# Patient Record
Sex: Male | Born: 2000 | Race: White | Hispanic: Yes | Marital: Single | Smoking: Never smoker
Health system: Southern US, Community
[De-identification: ages and names within clinical notes are randomized; demographics above are authoritative.]

## PROBLEM LIST (undated history)

## (undated) DIAGNOSIS — J45909 Unspecified asthma, uncomplicated: Secondary | ICD-10-CM

---
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
## 2018-02-23 ENCOUNTER — Emergency Department (HOSPITAL_COMMUNITY)
Admission: EM | Admit: 2018-02-23 | Discharge: 2018-02-23 | Disposition: A | Discharge status: Home / Self Care | Payer: Medicaid Other | Attending: Emergency Medicine | Admitting: Emergency Medicine

## 2018-02-23 ENCOUNTER — Emergency Department (HOSPITAL_COMMUNITY): Payer: Medicaid Other

## 2018-02-23 ENCOUNTER — Encounter (HOSPITAL_COMMUNITY): Payer: Self-pay | Admitting: Emergency Medicine

## 2018-02-23 DIAGNOSIS — R1013 Epigastric pain: Secondary | ICD-10-CM | POA: Insufficient documentation

## 2018-02-23 DIAGNOSIS — R079 Chest pain, unspecified: Secondary | ICD-10-CM | POA: Diagnosis present

## 2018-02-23 LAB — CBC
HCT: 41.8 % (ref 39.0–52.0)
HEMOGLOBIN: 14.3 g/dL (ref 13.0–17.0)
MCH: 30.7 pg (ref 26.0–34.0)
MCHC: 34.2 g/dL (ref 30.0–36.0)
MCV: 89.7 fL (ref 78.0–100.0)
Platelets: 194 10*3/uL (ref 150–400)
RBC: 4.66 MIL/uL (ref 4.22–5.81)
RDW: 12.5 % (ref 11.5–15.5)
WBC: 7.5 10*3/uL (ref 4.0–10.5)

## 2018-02-23 LAB — BASIC METABOLIC PANEL
ANION GAP: 10 (ref 5–15)
BUN: 14 mg/dL (ref 6–20)
CALCIUM: 9.5 mg/dL (ref 8.9–10.3)
CO2: 27 mmol/L (ref 22–32)
Chloride: 101 mmol/L (ref 101–111)
Creatinine, Ser: 0.98 mg/dL (ref 0.61–1.24)
Glucose, Bld: 86 mg/dL (ref 65–99)
Potassium: 3.6 mmol/L (ref 3.5–5.1)
SODIUM: 138 mmol/L (ref 135–145)

## 2018-02-23 LAB — I-STAT TROPONIN, ED: TROPONIN I, POC: 0.01 ng/mL (ref 0.00–0.08)

## 2018-02-23 LAB — D-DIMER, QUANTITATIVE: D-Dimer, Quant: <0.27 ug/mL-FEU (ref 0.00–0.50)

## 2018-02-23 MED ORDER — GI COCKTAIL ~~LOC~~
30.0000 mL | Freq: Once | ORAL | Status: AC
  Administered 2018-02-23: 30 mL via ORAL
  Filled 2018-02-23: qty 30

## 2018-02-23 MED ORDER — OMEPRAZOLE 20 MG PO CPDR: 20.0000 mg | DELAYED_RELEASE_CAPSULE | Freq: Two times a day (BID) | ORAL | 0 refills | Status: DC

## 2018-02-23 MED ORDER — RANITIDINE HCL 150 MG PO CAPS: 150.0000 mg | ORAL_CAPSULE | Freq: Every day | ORAL | 0 refills | Status: DC

## 2018-02-23 NOTE — ED Triage Notes (Signed)
Pt to ER for intermittent central chest pain x2 weeks. In NAD.VSS.

## 2018-02-23 NOTE — ED Provider Notes (Signed)
[facility] EMERGENCY DEPARTMENT Provider Note   CSN: [id number] Arrival date & time: 02/23/18  1042     History   Chief Complaint Chief Complaint  Patient presents with  . Chest Pain    HPI Nathan Glover is a 17 y.o. male.  HPI  17 year old male presenting for evaluation of chest pain.  Patient report within the past 2 weeks he has had several separate episodes of midsternal chest pain.  He described pain as a sharp pressure sensation, with some lightheadedness and shortness of breath usually manifests in the center of his chest occasionally radiates to his left shoulder.  Pain usually lasting less than an hour without any provocation.  Most recent chest pain was this morning while he was in class.  It last for approximately 40 minutes and has since resolved.  No specific treatment tried at that time.  No associated nausea, vomiting, sweating, tingling sensation, productive cough, fever, chills, abdominal pain or back pain.  Pain is not brought on by exertion.  No history of exertional syncope.  Denies alcohol or tobacco abuse.  Denies any strong family history of cardiac disease.  No prior history of PE or DVT, no recent surgery, prolonged bed rest, leg swelling or calf pain.  He is currently pain-free.  Denies any postprandial pain.  He does have a PCP but did not follow-up for this complaint yet.    History reviewed. No pertinent past medical history.  There are no active problems to display for this patient.   History reviewed. No pertinent surgical history.      Home Medications    Prior to Admission medications   Medication Sig Start Date End Date Taking? Authorizing Provider  HYDROcodone-acetaminophen (NORCO/VICODIN) 5-325 MG per tablet Take 1-2 tablets by mouth every 4 (four) hours as needed for moderate pain or severe pain. 09/17/14   Mirian Mo, MD    Family History History reviewed. No pertinent family history.  Social  History Social History   Tobacco Use  . Smoking status: Never Smoker  . Smokeless tobacco: Never Used  Substance Use Topics  . Alcohol use: Not on file  . Drug use: Not on file     Allergies   Patient has no known allergies.   Review of Systems Review of Systems  All other systems reviewed and are negative.    Physical Exam Updated Vital Signs BP 115/67 (BP Location: Right Arm)   Pulse 76   Temp 98.6 F (37 C) (Oral)   Resp 18   SpO2 99%   Physical Exam  Constitutional: He is oriented to person, place, and time. He appears well-developed and well-nourished. No distress.  HENT:  Head: Atraumatic.  Eyes: Conjunctivae are normal.  Neck: Normal range of motion. Neck supple.  Cardiovascular: Normal rate, regular rhythm, intact distal pulses and normal pulses.  Pulmonary/Chest: Effort normal and breath sounds normal. He has no decreased breath sounds. He has no wheezes. He has no rhonchi. He has no rales.  Abdominal: Soft. There is no tenderness.  Musculoskeletal:       Right lower leg: He exhibits no edema.       Left lower leg: He exhibits no tenderness.  Neurological: He is alert and oriented to person, place, and time.  Skin: No rash noted.  Psychiatric: He has a normal mood and affect.  Nursing note and vitals reviewed.    ED Treatments / Results  Labs (all labs ordered are listed, but only  abnormal results are displayed) Labs Reviewed  BASIC METABOLIC PANEL  CBC  D-DIMER, QUANTITATIVE (NOT AT [facility])  I-STAT TROPONIN, ED    EKG None ED ECG REPORT   Date: 02/23/2018  Rate: 78  Rhythm: normal sinus rhythm  QRS Axis: right  Intervals: normal  ST/T Wave abnormalities: early repolarization  Conduction Disutrbances:incomplete RBBB  Narrative Interpretation:   Old EKG Reviewed: none available  I have personally reviewed the EKG tracing and agree with the computerized printout as noted.    Radiology Dg Chest 2 View  Result Date:  02/23/2018 CLINICAL DATA:  Mid sternal chest pain, shortness of Breath EXAM: CHEST - 2 VIEW COMPARISON:  None. FINDINGS: Heart and mediastinal contours are within normal limits. No focal opacities or effusions. No acute bony abnormality. IMPRESSION: No active cardiopulmonary disease. Electronically Signed   By: Charlett NoseKevin  Dover M.D.   On: 02/23/2018 11:17    Procedures Procedures (including critical care time)  Medications Ordered in ED Medications  gi cocktail (Maalox,Lidocaine,Donnatal) (30 mLs Oral Given 02/23/18 1249)     Initial Impression / Assessment and Plan / ED Course  I have reviewed the triage vital signs and the nursing notes.  Pertinent labs & imaging results that were available during my care of the patient were reviewed by me and considered in my medical decision making (see chart for details).     BP 115/67 (BP Location: Right Arm)   Pulse 76   Temp 98.6 F (37 C) (Oral)   Resp 18   SpO2 99%    Final Clinical Impressions(s) / ED Diagnoses   Final diagnoses:  Epigastric pain    ED Discharge Orders        Ordered    omeprazole (PRILOSEC) 20 MG capsule  2 times daily before meals     02/23/18 1425    ranitidine (ZANTAC) 150 MG capsule  Daily     02/23/18 1425     12:59 PM Patient developed intermittent chest pain shortness of breath. HEART score of 1, low risk of MACE.  D-dimer negative, doubt PE.  EKG showing incomplete RBBB, no prior for comparison. CXR unremarkable.  Pt is pain free. Care discussed with DR. Nanavati.  Pt d/c home with outpt f//u .  Return precaution given.   Suspect gastritis as pt report eating late at night usually at 12 am.  Counsel given.    Fayrene Helperran, Shyenne Maggard, PA-C 02/23/18 1426    Derwood KaplanNanavati, Ankit, MD 02/24/18 1529

## 2018-08-02 ENCOUNTER — Emergency Department (HOSPITAL_COMMUNITY)
Admission: EM | Admit: 2018-08-02 | Discharge: 2018-08-03 | Disposition: A | Discharge status: Home / Self Care | Payer: Medicaid Other | Attending: Emergency Medicine | Admitting: Emergency Medicine

## 2018-08-02 DIAGNOSIS — R0789 Other chest pain: Secondary | ICD-10-CM | POA: Insufficient documentation

## 2018-08-02 DIAGNOSIS — Z79899 Other long term (current) drug therapy: Secondary | ICD-10-CM | POA: Insufficient documentation

## 2018-08-02 NOTE — ED Triage Notes (Signed)
Pt states that for the past 3 days he has been having epigastric CP with nausea and some dizziness. Denies cough or heavy lifting

## 2018-08-03 ENCOUNTER — Emergency Department (HOSPITAL_COMMUNITY): Payer: Medicaid Other

## 2018-08-03 ENCOUNTER — Encounter (HOSPITAL_COMMUNITY): Payer: Self-pay

## 2018-08-03 LAB — CBC
HEMATOCRIT: 44.4 % (ref 39.0–52.0)
HEMOGLOBIN: 15 g/dL (ref 13.0–17.0)
MCH: 30.8 pg (ref 26.0–34.0)
MCHC: 33.8 g/dL (ref 30.0–36.0)
MCV: 91.2 fL (ref 78.0–100.0)
Platelets: 225 10*3/uL (ref 150–400)
RBC: 4.87 MIL/uL (ref 4.22–5.81)
RDW: 12.4 % (ref 11.5–15.5)
WBC: 8.6 10*3/uL (ref 4.0–10.5)

## 2018-08-03 LAB — BASIC METABOLIC PANEL
ANION GAP: 11 (ref 5–15)
BUN: 15 mg/dL (ref 6–20)
CHLORIDE: 102 mmol/L (ref 98–111)
CO2: 26 mmol/L (ref 22–32)
Calcium: 9.7 mg/dL (ref 8.9–10.3)
Creatinine, Ser: 1 mg/dL (ref 0.61–1.24)
GFR calc non Af Amer: >60 mL/min (ref >60)
Glucose, Bld: 100 mg/dL — ABNORMAL HIGH (ref 70–99)
POTASSIUM: 3.8 mmol/L (ref 3.5–5.1)
Sodium: 139 mmol/L (ref 135–145)

## 2018-08-03 LAB — I-STAT TROPONIN, ED: Troponin i, poc: 0 ng/mL (ref 0.00–0.08)

## 2018-08-03 NOTE — ED Notes (Signed)
Patient verbalizes understanding of discharge instructions. Opportunity for questioning and answers were provided. Armband removed by staff, pt discharged from ED ambulatory.   

## 2018-08-03 NOTE — ED Provider Notes (Signed)
[facility] EMERGENCY DEPARTMENT Provider Note  CSN: [id number] Arrival date & time: 08/02/18 2339  Chief Complaint(s) Chest Pain  HPI Nathan Glover is a 17 y.o. male   The history is provided by the patient.  Chest Pain   This is a recurrent problem. The current episode started 2 days ago. Episode frequency: intermittent. The problem has been resolved. Pain location: migrating across chest. The pain is moderate. The quality of the pain is described as stabbing. The pain does not radiate. The symptoms are aggravated by certain positions (lying down). Associated symptoms include nausea. Pertinent negatives include no back pain, no cough, no fever, no leg pain, no lower extremity edema, no shortness of breath and no vomiting.  Pertinent negatives for past medical history include no CAD, no CHF, no diabetes, no hyperlipidemia, no hypertension, no MI, no PE, no strokes and no TIA.   Similar from prior episode attributed to gastritis.  Past Medical History History reviewed. No pertinent past medical history. There are no active problems to display for this patient.  Home Medication(s) Prior to Admission medications   Medication Sig Start Date End Date Taking? Authorizing Provider  HYDROcodone-acetaminophen (NORCO/VICODIN) 5-325 MG per tablet Take 1-2 tablets by mouth every 4 (four) hours as needed for moderate pain or severe pain. 09/17/14   Mirian Mo, MD  omeprazole (PRILOSEC) 20 MG capsule Take 1 capsule (20 mg total) by mouth 2 (two) times daily before a meal. 02/23/18   Fayrene Helper, PA-C  ranitidine (ZANTAC) 150 MG capsule Take 1 capsule (150 mg total) by mouth daily. 02/23/18   Fayrene Helper, PA-C                                                                                                                                    Past Surgical History History reviewed. No pertinent surgical history. Family History No family history on file.  Social  History Social History   Tobacco Use  . Smoking status: Never Smoker  . Smokeless tobacco: Never Used  Substance Use Topics  . Alcohol use: Not on file  . Drug use: Not on file   Allergies Patient has no known allergies.  Review of Systems Review of Systems  Constitutional: Negative for fever.  Respiratory: Negative for cough and shortness of breath.   Cardiovascular: Positive for chest pain.  Gastrointestinal: Positive for nausea. Negative for vomiting.  Musculoskeletal: Negative for back pain.   All other systems are reviewed and are negative for acute change except as noted in the HPI  Physical Exam Vital Signs  I have reviewed the triage vital signs BP 117/75   Pulse 62   Temp 98.4 F (36.9 C) (Oral)   Resp 14   SpO2 99%   Physical Exam  Constitutional: He is oriented to person, place, and time. He appears well-developed and well-nourished. No distress.  HENT:  Head: Normocephalic and atraumatic.  Nose:  Nose normal.  Eyes: Pupils are equal, round, and reactive to light. Conjunctivae and EOM are normal. Right eye exhibits no discharge. Left eye exhibits no discharge. No scleral icterus.  Neck: Normal range of motion. Neck supple.  Cardiovascular: Normal rate and regular rhythm. Exam reveals no gallop and no friction rub.  No murmur heard. Pulmonary/Chest: Effort normal and breath sounds normal. No stridor. No respiratory distress. He has no rales.  Abdominal: Soft. He exhibits no distension. There is no tenderness.  Musculoskeletal: He exhibits no edema or tenderness.  Neurological: He is alert and oriented to person, place, and time.  Skin: Skin is warm and dry. No rash noted. He is not diaphoretic. No erythema.  Psychiatric: He has a normal mood and affect.  Vitals reviewed.   ED Results and Treatments Labs (all labs ordered are listed, but only abnormal results are displayed) Labs Reviewed  BASIC METABOLIC PANEL - Abnormal; Notable for the following  components:      Result Value   Glucose, Bld 100 (*)    All other components within normal limits  CBC  I-STAT TROPONIN, ED                                                                                                                         EKG  EKG Interpretation  Date/Time:  Tuesday August 02 2018 23:47:47 EDT Ventricular Rate:  73 PR Interval:  142 QRS Duration: 100 QT Interval:  354 QTC Calculation: 389 R Axis:   87 Text Interpretation:  Normal sinus rhythm with sinus arrhythmia Incomplete right bundle branch block Borderline ECG No significant change since last tracing Confirmed by Drema Pryardama, Andrika Peraza [phone]([id number]) on 08/03/2018 3:34:16 AM      Radiology Dg Chest 2 View  Result Date: 08/03/2018 CLINICAL DATA:  Epigastric chest pain, nausea, and dizziness for 3 days. EXAM: CHEST - 2 VIEW COMPARISON:  02/23/2018 FINDINGS: The heart size and mediastinal contours are within normal limits. Both lungs are clear. The visualized skeletal structures are unremarkable. IMPRESSION: No active cardiopulmonary disease. Electronically Signed   By: Burman NievesWilliam  Stevens M.D.   On: 08/03/2018 00:14   Pertinent labs & imaging results that were available during my care of the patient were reviewed by me and considered in my medical decision making (see chart for details).  Medications Ordered in ED Medications - No data to display  Procedures Procedures  (including critical care time)  Medical Decision Making / ED Course I have reviewed the nursing notes for this encounter and the patient's prior records (if available in EHR or on provided paperwork).    Highly atypical.  Inconsistent with ACS.  EKG without acute ischemic changes or evidence of pericarditis.  Troponin negative.  Do not feel that additional cardiac markers are necessary at this time.  Presentation likely GI  related.  Doubt pulmonary embolism and PERC negative.  Low suspicion for serious intra-abdominal inflammatory/infectious process.  Chest x-ray without evidence suggestive of pneumonia, pneumothorax, pneumomediastinum.  No abnormal contour of the mediastinum to suggest dissection. No evidence of acute injuries.  The patient appears reasonably screened and/or stabilized for discharge and I doubt any other medical condition or other [facility] requiring further screening, evaluation, or treatment in the ED at this time prior to discharge.  The patient is safe for discharge with strict return precautions.   Final Clinical Impression(s) / ED Diagnoses Final diagnoses:  Atypical chest pain    Disposition: Discharge  Condition: Good  I have discussed the results, Dx and Tx plan with the patient who expressed understanding and agree(s) with the plan. Discharge instructions discussed at great length. The patient was given strict return precautions who verbalized understanding of the instructions. No further questions at time of discharge.    ED Discharge Orders    None       Follow Up: Primary care provider   in 5-7 days, If symptoms do not improve or  worsen     This chart was dictated using voice recognition software.  Despite best efforts to proofread,  errors can occur which can change the documentation meaning.   Nira Conn, MD 08/03/18 [phone]

## 2019-02-11 ENCOUNTER — Encounter (HOSPITAL_COMMUNITY): Payer: Self-pay

## 2019-02-11 ENCOUNTER — Other Ambulatory Visit: Payer: Self-pay

## 2019-02-11 ENCOUNTER — Emergency Department (HOSPITAL_COMMUNITY)
Admission: EM | Admit: 2019-02-11 | Discharge: 2019-02-12 | Disposition: A | Discharge status: Home / Self Care | Payer: Medicaid Other | Attending: Emergency Medicine | Admitting: Emergency Medicine

## 2019-02-11 DIAGNOSIS — Y9389 Activity, other specified: Secondary | ICD-10-CM | POA: Insufficient documentation

## 2019-02-11 DIAGNOSIS — W19XXXA Unspecified fall, initial encounter: Secondary | ICD-10-CM

## 2019-02-11 DIAGNOSIS — Y999 Unspecified external cause status: Secondary | ICD-10-CM | POA: Insufficient documentation

## 2019-02-11 DIAGNOSIS — Z79899 Other long term (current) drug therapy: Secondary | ICD-10-CM | POA: Diagnosis not present

## 2019-02-11 DIAGNOSIS — S0101XA Laceration without foreign body of scalp, initial encounter: Secondary | ICD-10-CM | POA: Insufficient documentation

## 2019-02-11 DIAGNOSIS — Y9289 Other specified places as the place of occurrence of the external cause: Secondary | ICD-10-CM | POA: Diagnosis not present

## 2019-02-11 DIAGNOSIS — S0990XA Unspecified injury of head, initial encounter: Secondary | ICD-10-CM | POA: Diagnosis present

## 2019-02-11 DIAGNOSIS — W108XXA Fall (on) (from) other stairs and steps, initial encounter: Secondary | ICD-10-CM | POA: Diagnosis not present

## 2019-02-11 MED ORDER — TETANUS-DIPHTH-ACELL PERTUSSIS 5-2.5-18.5 LF-MCG/0.5 IM SUSP
0.5000 mL | Freq: Once | INTRAMUSCULAR | Status: AC
  Administered 2019-02-11: 0.5 mL via INTRAMUSCULAR
  Filled 2019-02-11: qty 0.5

## 2019-02-11 MED ORDER — LIDOCAINE-EPINEPHRINE-TETRACAINE (LET) SOLUTION
3.0000 mL | Freq: Once | NASAL | Status: AC
  Administered 2019-02-11: 3 mL via TOPICAL
  Filled 2019-02-11: qty 3

## 2019-02-11 NOTE — ED Triage Notes (Signed)
Pt fell down from steps at home. Hit the back of his head. 1 inch laceration noted. Bleed controlled. Denies loss of consciousness. States "he was shaking when he hit the ground". Unaware if up to date with tetanus shot.  Pupils equal and reactive to light.

## 2019-02-11 NOTE — ED Notes (Signed)
Patient transported to CT 

## 2019-02-12 ENCOUNTER — Emergency Department (HOSPITAL_COMMUNITY): Payer: Medicaid Other

## 2019-02-12 DIAGNOSIS — S0101XA Laceration without foreign body of scalp, initial encounter: Secondary | ICD-10-CM | POA: Diagnosis not present

## 2019-02-12 DIAGNOSIS — Z79899 Other long term (current) drug therapy: Secondary | ICD-10-CM | POA: Diagnosis not present

## 2019-02-12 MED ORDER — IBUPROFEN 400 MG PO TABS
600.0000 mg | ORAL_TABLET | Freq: Once | ORAL | Status: AC
  Administered 2019-02-12: 600 mg via ORAL
  Filled 2019-02-12: qty 1

## 2019-02-12 NOTE — ED Provider Notes (Signed)
[facility] EMERGENCY DEPARTMENT Provider Note   CSN: [id number] Arrival date & time: 02/11/19  2309    History   Chief Complaint Chief Complaint  Patient presents with   Laceration    HPI Nathan Glover is a 18 y.o. male with no pertinent past medical history who presents to the emergency department with a chief complaint of fall.  The patient reports that he tripped over his flip-flop and fell backwards down approximately 5 wooden steps.  He reports that the injury occurred just prior to arrival.  His significant other reports that his arms and legs were shaking for approximately 5-10 seconds after the fall.  The patient states that he does not remember this, but was aware and answering questions within 10 seconds after shaking of his arms and legs ceased.  He is unsure if he blacked out.  He reports that he was able to stand and walk after the fall.   He has a laceration to the right posterior scalp.  He is complaining of a generalized headache. He denies any fever, chills, cough, dizziness, visual changes, neck pain, nausea, vomiting, abdominal pain, chest pain.  No history of seizures or other pertinent past medical history.  He denies alcohol use or other IV or recreational drug use.  No treatment prior to arrival.  He does not take any blood thinners.  He is unsure when his Tdap was updated.     The history is provided by the patient. No language interpreter was used.    History reviewed. No pertinent past medical history.  There are no active problems to display for this patient.   History reviewed. No pertinent surgical history.      Home Medications    Prior to Admission medications   Medication Sig Start Date End Date Taking? Authorizing Provider  HYDROcodone-acetaminophen (NORCO/VICODIN) 5-325 MG per tablet Take 1-2 tablets by mouth every 4 (four) hours as needed for moderate pain or severe pain. 09/17/14   Mirian Mo, MD    omeprazole (PRILOSEC) 20 MG capsule Take 1 capsule (20 mg total) by mouth 2 (two) times daily before a meal. 02/23/18   Fayrene Helper, PA-C  ranitidine (ZANTAC) 150 MG capsule Take 1 capsule (150 mg total) by mouth daily. 02/23/18   Fayrene Helper, PA-C    Family History History reviewed. No pertinent family history.  Social History Social History   Tobacco Use   Smoking status: Never Smoker   Smokeless tobacco: Never Used  Substance Use Topics   Alcohol use: Not on file   Drug use: Not on file     Allergies   Patient has no known allergies.   Review of Systems Review of Systems  Constitutional: Negative for appetite change and fever.  HENT: Negative for congestion, sinus pressure, sinus pain and sore throat.   Eyes: Negative for visual disturbance.  Respiratory: Negative for shortness of breath.   Cardiovascular: Negative for chest pain.  Gastrointestinal: Negative for abdominal pain, diarrhea, nausea and vomiting.  Genitourinary: Negative for dysuria, hematuria and urgency.  Musculoskeletal: Negative for back pain.  Skin: Positive for wound. Negative for rash.  Allergic/Immunologic: Negative for immunocompromised state.  Neurological: Positive for headaches. Negative for dizziness, speech difficulty and weakness.  Psychiatric/Behavioral: Negative for confusion.     Physical Exam Updated Vital Signs BP 121/84 (BP Location: Right Arm)    Pulse (!) 110    Temp 99 F (37.2 C) (Oral)    Resp 16    Ht   (1.727 m)    Wt 70.3 kg    SpO2 97%    BMI 23.57 kg/m   Physical Exam Vitals signs and nursing note reviewed.  Constitutional:      Appearance: He is well-developed.  HENT:     Head: Normocephalic.  Eyes:     Extraocular Movements: Extraocular movements intact.     Conjunctiva/sclera: Conjunctivae normal.     Pupils: Pupils are equal, round, and reactive to light.  Neck:     Musculoskeletal: Neck supple.  Cardiovascular:     Rate and Rhythm: Normal rate and  regular rhythm.     Heart sounds: No murmur.  Pulmonary:     Effort: Pulmonary effort is normal. No respiratory distress.     Breath sounds: No stridor. No wheezing, rhonchi or rales.  Chest:     Chest wall: No tenderness.  Abdominal:     General: There is no distension.     Palpations: Abdomen is soft.  Skin:    General: Skin is warm and dry.     Comments: There is a 2 cm laceration that is straight and is superficial noted to the right occipital scalp.  Minimal bleeding is noted from the wound.  No obvious foreign bodies in the wound appears clean.  Neurological:     Mental Status: He is alert.     Comments: Alert and oriented x3.  GCS 15.  Moves all 4 extremities.  Ambulatory without difficulty.  Finger-to-nose is intact bilaterally.  Cranial nerves II through XII are grossly intact.  Sensation is intact and equal throughout.  5-5 strength against resistance of the bilateral upper and lower extremities.  Psychiatric:        Behavior: Behavior normal.      ED Treatments / Results  Labs (all labs ordered are listed, but only abnormal results are displayed) Labs Reviewed - No data to display  EKG None  Radiology Ct Head Wo Contrast  Result Date: 02/12/2019 CLINICAL DATA:  Initial evaluation for acute headache status post trauma. EXAM: CT HEAD WITHOUT CONTRAST CT CERVICAL SPINE WITHOUT CONTRAST TECHNIQUE: Multidetector CT imaging of the head and cervical spine was performed following the standard protocol without intravenous contrast. Multiplanar CT image reconstructions of the cervical spine were also generated. COMPARISON:  None. FINDINGS: CT HEAD FINDINGS Brain: Cerebral volume within normal limits for patient age. No evidence for acute intracranial hemorrhage. No findings to suggest acute large vessel territory infarct. No mass lesion, midline shift, or mass effect. Ventricles are normal in size without evidence for hydrocephalus. No extra-axial fluid collection identified.  Vascular: No hyperdense vessel identified. Skull: Small soft tissue contusion with laceration at the right occipital scalp. No associated calvarial fracture. Sinuses/Orbits: Globes and orbital soft tissues within normal limits. Mild mucosal thickening within the right maxillary sinus. Paranasal sinuses are otherwise clear. No mastoid effusion. CT CERVICAL SPINE FINDINGS Alignment: Straightening with slight reversal of the normal cervical lordosis. No listhesis or malalignment. Skull base and vertebrae: Skull base intact. Normal C1-2 articulations are preserved in the dens is intact. Vertebral body heights maintained. No acute fracture. Soft tissues and spinal canal: Soft tissues of the neck demonstrate no acute finding. No abnormal prevertebral edema. Spinal canal within normal limits. Disc levels: No significant degenerative disc disease within the cervical spine. Upper chest: Visualized upper chest demonstrates no acute finding. Visualized lung apices are clear. Other: None. IMPRESSION: CT BRAIN: 1. No acute intracranial abnormality. 2. Small right occipital scalp contusion with laceration. No  associated calvarial fracture. CT CERVICAL SPINE: No acute traumatic injury within the cervical spine. Electronically Signed   By: Rise Mu M.D.   On: 02/12/2019 00:45   Ct Cervical Spine Wo Contrast  Result Date: 02/12/2019 CLINICAL DATA:  Initial evaluation for acute headache status post trauma. EXAM: CT HEAD WITHOUT CONTRAST CT CERVICAL SPINE WITHOUT CONTRAST TECHNIQUE: Multidetector CT imaging of the head and cervical spine was performed following the standard protocol without intravenous contrast. Multiplanar CT image reconstructions of the cervical spine were also generated. COMPARISON:  None. FINDINGS: CT HEAD FINDINGS Brain: Cerebral volume within normal limits for patient age. No evidence for acute intracranial hemorrhage. No findings to suggest acute large vessel territory infarct. No mass lesion,  midline shift, or mass effect. Ventricles are normal in size without evidence for hydrocephalus. No extra-axial fluid collection identified. Vascular: No hyperdense vessel identified. Skull: Small soft tissue contusion with laceration at the right occipital scalp. No associated calvarial fracture. Sinuses/Orbits: Globes and orbital soft tissues within normal limits. Mild mucosal thickening within the right maxillary sinus. Paranasal sinuses are otherwise clear. No mastoid effusion. CT CERVICAL SPINE FINDINGS Alignment: Straightening with slight reversal of the normal cervical lordosis. No listhesis or malalignment. Skull base and vertebrae: Skull base intact. Normal C1-2 articulations are preserved in the dens is intact. Vertebral body heights maintained. No acute fracture. Soft tissues and spinal canal: Soft tissues of the neck demonstrate no acute finding. No abnormal prevertebral edema. Spinal canal within normal limits. Disc levels: No significant degenerative disc disease within the cervical spine. Upper chest: Visualized upper chest demonstrates no acute finding. Visualized lung apices are clear. Other: None. IMPRESSION: CT BRAIN: 1. No acute intracranial abnormality. 2. Small right occipital scalp contusion with laceration. No associated calvarial fracture. CT CERVICAL SPINE: No acute traumatic injury within the cervical spine. Electronically Signed   By: Rise Mu M.D.   On: 02/12/2019 00:45    Procedures .Marland KitchenLaceration Repair Date/Time: 02/12/2019 1:22 AM Performed by: Barkley Boards, PA-C Authorized by: Barkley Boards, PA-C   Consent:    Consent obtained:  Verbal   Consent given by:  Patient   Risks discussed:  Infection, poor cosmetic result, poor wound healing, retained foreign body and nerve damage   Alternatives discussed:  No treatment Anesthesia (see MAR for exact dosages):    Anesthesia method:  Topical application   Topical anesthetic:  LET Laceration details:     Location:  Scalp   Scalp location:  Occipital   Length (cm):  2 Repair type:    Repair type:  Simple Pre-procedure details:    Preparation:  Patient was prepped and draped in usual sterile fashion and imaging obtained to evaluate for foreign bodies Exploration:    Hemostasis achieved with:  Direct pressure   Wound exploration: wound explored through full range of motion and entire depth of wound probed and visualized   Treatment:    Area cleansed with:  Saline   Amount of cleaning:  Standard   Visualized foreign bodies/material removed: no   Skin repair:    Repair method:  Staples   Number of staples:  4 Approximation:    Approximation:  Close Post-procedure details:    Dressing:  Open (no dressing)   Patient tolerance of procedure:  Tolerated well, no immediate complications   (including critical care time)  Medications Ordered in ED Medications  ibuprofen (ADVIL,MOTRIN) tablet 600 mg (has no administration in time range)  Tdap (BOOSTRIX) injection 0.5 mL (0.5 mLs Intramuscular Given  02/11/19 2352)  lidocaine-EPINEPHrine-tetracaine (LET) solution (3 mLs Topical Given 02/11/19 2353)     Initial Impression / Assessment and Plan / ED Course  I have reviewed the triage vital signs and the nursing notes.  Pertinent labs & imaging results that were available during my care of the patient were reviewed by me and considered in my medical decision making (see chart for details).        18 year old male who presents to the emergency department with a chief complaint of fall.  He had a mechanical fall down approximately 5 wooden steps after he tripped over his flip-flops.  He has a 2 cm laceration to the right occipital scalp.  He is unsure if he syncopized.  His significant other, who is not present in the ER, reports that he had shaking of his arms and legs for less than 10 seconds and within 10 seconds after this shaking subsided but he was awake, oriented, and answering questions.   He was able to ambulate independently after the fall.  Neurologic exam in the ER is intact.  Tdap updated in the ED.  Let was applied to the wound.  CT head and cervical spine are unremarkable.  On his exam, he had no other focal tenderness and was endorsing no other areas of pain.  Prior to wound repair, the base of the wound was visualized and there were no obvious foreign bodies.  The wound was repaired with 4 staples and was hemostatic following laceration repair.  Ibuprofen given for pain control.  He was given precautions on postconcussive syndrome.  Based on the history of present illness, I have a low suspicion that the patient had a seizure.  He was given return precautions to the ER for new or worsening symptoms and advised to have the staples removed in 10 days.  He is hemodynamically stable and in no acute distress.  Safe for discharge home with outpatient follow-up at this time.    Final Clinical Impressions(s) / ED Diagnoses   Final diagnoses:  Fall, initial encounter  Laceration of scalp, initial encounter    ED Discharge Orders    None       Petra Sargeant A, PA-C 02/12/19 0124    Ward, Layla Maw, DO 02/12/19 [phone]

## 2019-02-12 NOTE — Discharge Instructions (Addendum)
Thank you for allowing me to care for you today in the Emergency Department.   Your staples need to be removed in 10 days.  You can have them removed by coming back to the ER, going to urgent care, or following up with primary practice.  You can take 600 mg of ibuprofen with food every 6 hours for pain control or take 650 mg of Tylenol once every 6 hours.  Alternatively, you can alternate between these 2 medications every 3 hours.  For example, you can take a dose of ibuprofen at noon then take a dose of Tylenol at 3 PM then a second dose of ibuprofen at 6 PM.  Keep the area clean and dry with warm soap and water.  Additional wound care instructions are attached along with your discharge instructions.  As we discussed, sometimes after a head injury you can develop concussion-like symptoms.  This can include difficulty with memory or thinking of words.  Typically these symptoms will resolve on their own within a few weeks.  Return to the emergency department if you develop fever, chills, new numbness or weakness, changes in your vision, persistent vomiting, if you have any fall or injury, or persistent shaking or jerking.

## 2020-05-16 DIAGNOSIS — Z419 Encounter for procedure for purposes other than remedying health state, unspecified: Secondary | ICD-10-CM | POA: Diagnosis not present

## 2020-06-16 DIAGNOSIS — Z419 Encounter for procedure for purposes other than remedying health state, unspecified: Secondary | ICD-10-CM | POA: Diagnosis not present

## 2020-07-17 DIAGNOSIS — Z419 Encounter for procedure for purposes other than remedying health state, unspecified: Secondary | ICD-10-CM | POA: Diagnosis not present

## 2020-08-06 IMAGING — CT CT CERVICAL SPINE WITHOUT CONTRAST
4 of 7 series · 12 of 33 positions shown, 13 images · non-contrast
Comparison: None.

CLINICAL DATA: Initial evaluation for acute headache status post
trauma.

EXAM:
CT HEAD WITHOUT CONTRAST
CT CERVICAL SPINE WITHOUT CONTRAST
TECHNIQUE: Multidetector CT imaging of the head and cervical spine was
performed following the standard protocol without intravenous
contrast. Multiplanar CT image reconstructions of the cervical spine
were also generated.

[Series 9: c_spine 2.0 st · axial · 0.27mm/px · z∈[-297,-183]mm · 4 of 95 slices shown, 5 images]
[im 19/95  soft-tissue]
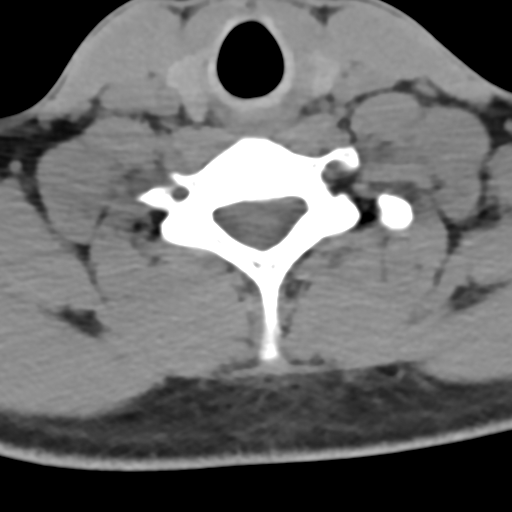
[im 19/95  bone]
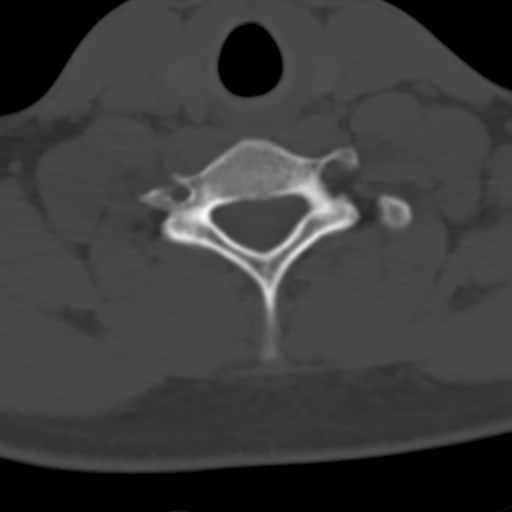
[im 38/95  bone]
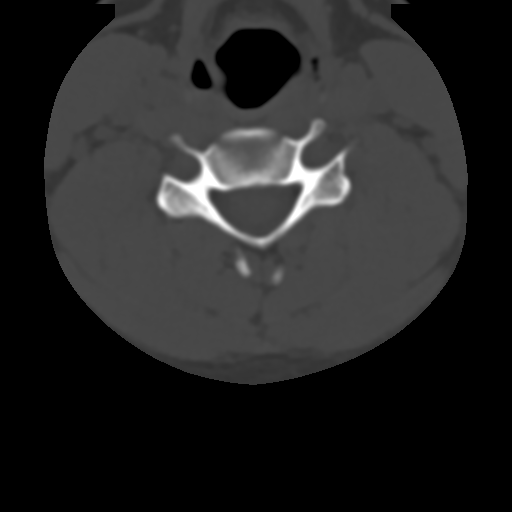
[im 57/95  bone]
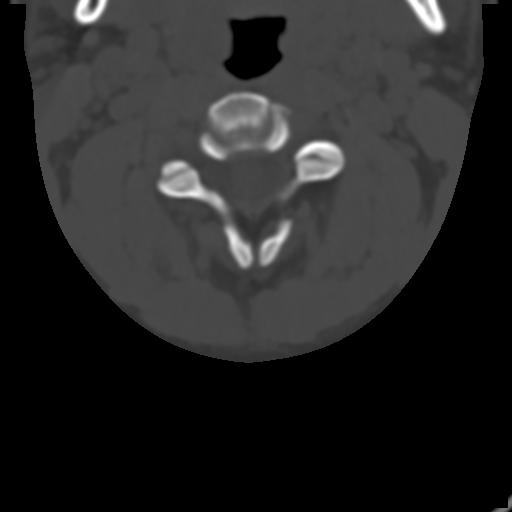
[im 76/95  bone]
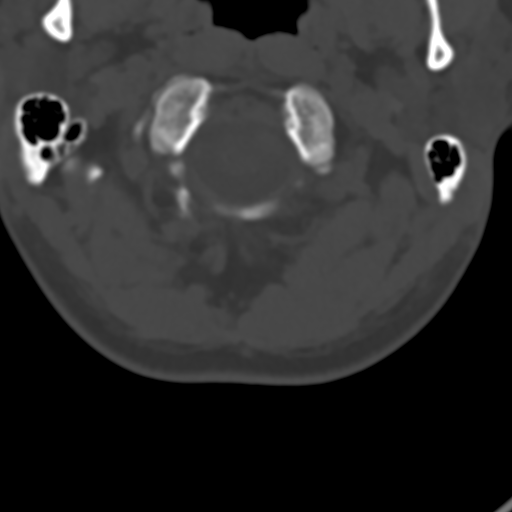

[Series 10: coronal bone · coronal · 0.24mm/px · 1 of 61 slices shown]
[im 31/61  bone]
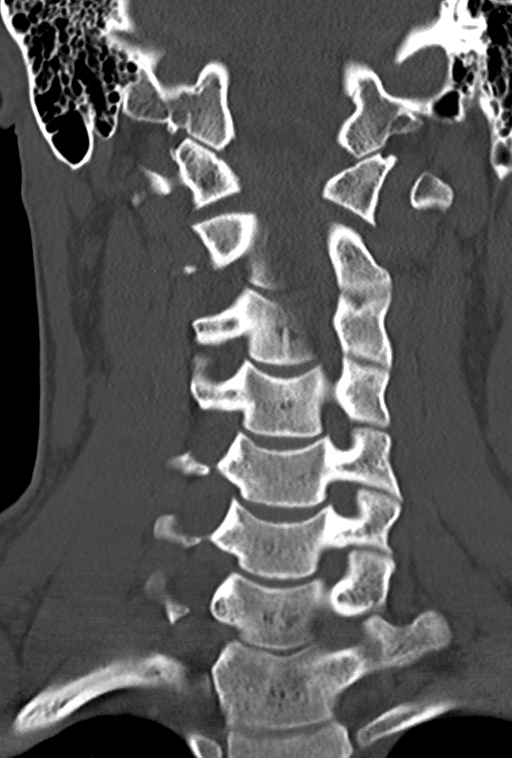

[Series 11: sagittal bone · sagittal · 0.24mm/px · 4 of 61 slices shown]
[im 13/61  bone]
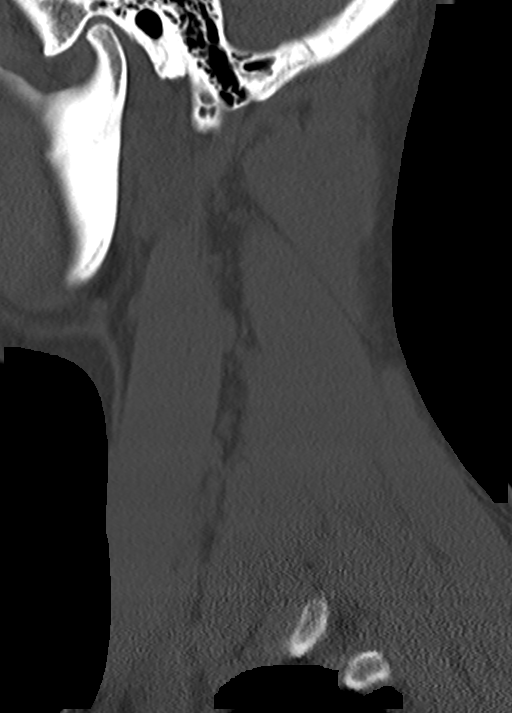
[im 25/61  bone]
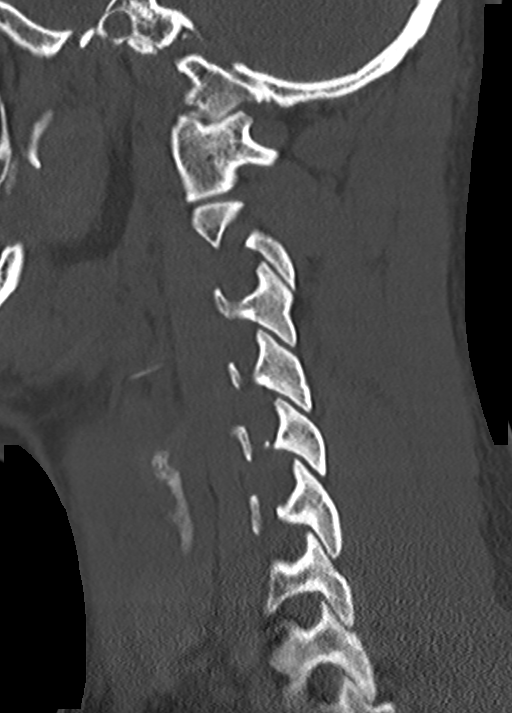
[im 37/61  bone]
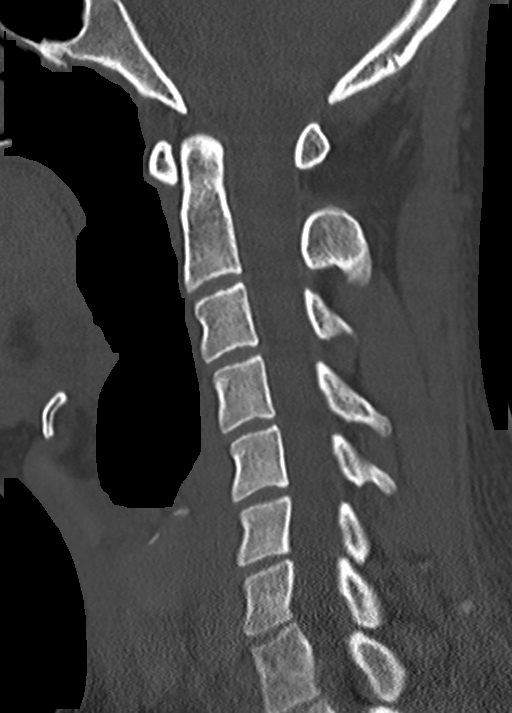
[im 49/61  bone]
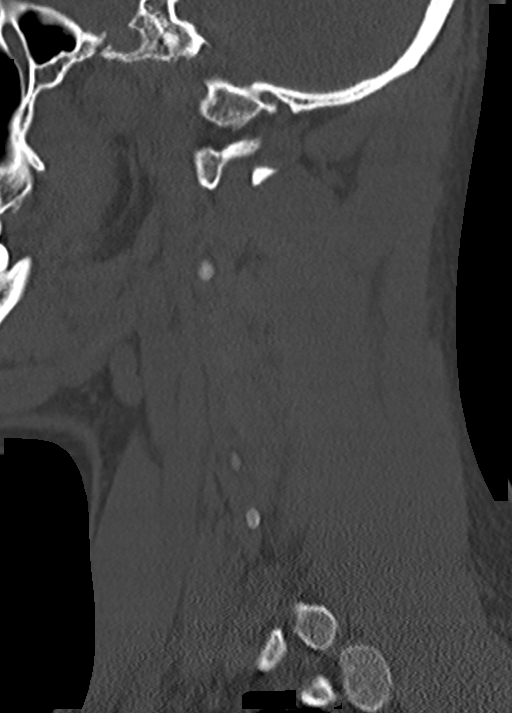

[Series 13: orthogonal axial st · axial · 0.21mm/px · z∈[-297,-228]mm · 3 of 81 slices shown]
[im 21/81  bone]
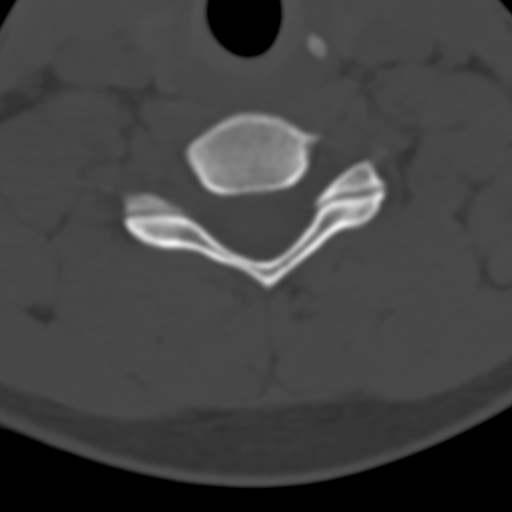
[im 41/81  bone]
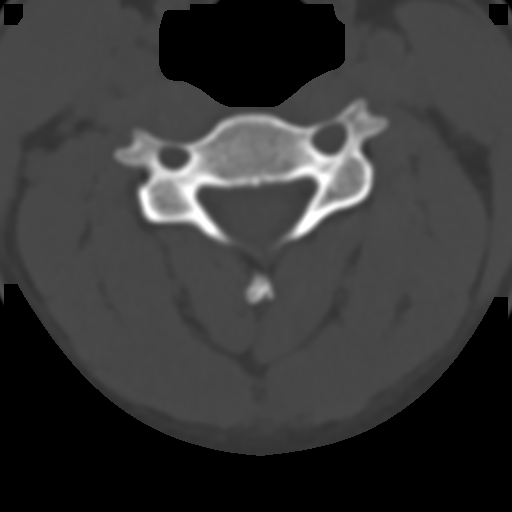
[im 61/81  bone]
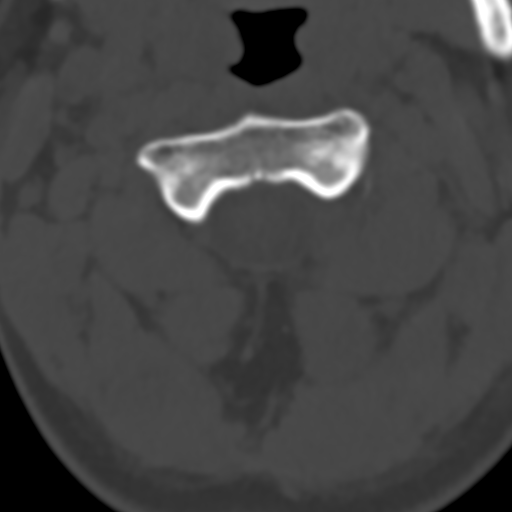

[12 of 33 positions shown; findings below may reference images not displayed]

FINDINGS: CT HEAD FINDINGS

Brain: Cerebral volume within normal limits for patient age.

No evidence for acute intracranial hemorrhage. No findings to
suggest acute large vessel territory infarct. No mass lesion,
midline shift, or mass effect. Ventricles are normal in size without
evidence for hydrocephalus. No extra-axial fluid collection
identified.

Vascular: No hyperdense vessel identified.

Skull: Small soft tissue contusion with laceration at the right
occipital scalp. No associated calvarial fracture.

Sinuses/Orbits: Globes and orbital soft tissues within normal
limits.

Mild mucosal thickening within the right maxillary sinus. Paranasal
sinuses are otherwise clear. No mastoid effusion.

CT CERVICAL SPINE FINDINGS

Alignment: Straightening with slight reversal of the normal cervical
lordosis. No listhesis or malalignment.

Skull base and vertebrae: Skull base intact. Normal C1-2
articulations are preserved in the dens is intact. Vertebral body
heights maintained. No acute fracture.

Soft tissues and spinal canal: Soft tissues of the neck demonstrate
no acute finding. No abnormal prevertebral edema. Spinal canal
within normal limits.

Disc levels: No significant degenerative disc disease within the
cervical spine.

Upper chest: Visualized upper chest demonstrates no acute finding.
Visualized lung apices are clear.

Other: None.
IMPRESSION: CT BRAIN:

1. No acute intracranial abnormality.
2. Small right occipital scalp contusion with laceration. No
associated calvarial fracture.

CT CERVICAL SPINE:

No acute traumatic injury within the cervical spine.

## 2020-08-16 DIAGNOSIS — Z419 Encounter for procedure for purposes other than remedying health state, unspecified: Secondary | ICD-10-CM | POA: Diagnosis not present

## 2020-09-16 DIAGNOSIS — Z419 Encounter for procedure for purposes other than remedying health state, unspecified: Secondary | ICD-10-CM | POA: Diagnosis not present

## 2020-10-16 DIAGNOSIS — Z419 Encounter for procedure for purposes other than remedying health state, unspecified: Secondary | ICD-10-CM | POA: Diagnosis not present

## 2020-11-16 DIAGNOSIS — Z419 Encounter for procedure for purposes other than remedying health state, unspecified: Secondary | ICD-10-CM | POA: Diagnosis not present

## 2020-12-17 DIAGNOSIS — Z419 Encounter for procedure for purposes other than remedying health state, unspecified: Secondary | ICD-10-CM | POA: Diagnosis not present

## 2021-01-14 DIAGNOSIS — Z419 Encounter for procedure for purposes other than remedying health state, unspecified: Secondary | ICD-10-CM | POA: Diagnosis not present

## 2021-02-14 DIAGNOSIS — Z419 Encounter for procedure for purposes other than remedying health state, unspecified: Secondary | ICD-10-CM | POA: Diagnosis not present

## 2021-03-16 DIAGNOSIS — Z419 Encounter for procedure for purposes other than remedying health state, unspecified: Secondary | ICD-10-CM | POA: Diagnosis not present

## 2021-04-16 DIAGNOSIS — Z419 Encounter for procedure for purposes other than remedying health state, unspecified: Secondary | ICD-10-CM | POA: Diagnosis not present

## 2021-05-07 ENCOUNTER — Emergency Department (HOSPITAL_COMMUNITY): Payer: Medicaid Other

## 2021-05-07 ENCOUNTER — Emergency Department (HOSPITAL_COMMUNITY)
Admission: EM | Admit: 2021-05-07 | Discharge: 2021-05-07 | Disposition: A | Discharge status: Home / Self Care | Payer: Medicaid Other | Attending: Emergency Medicine | Admitting: Emergency Medicine

## 2021-05-07 ENCOUNTER — Encounter (HOSPITAL_COMMUNITY): Payer: Self-pay

## 2021-05-07 ENCOUNTER — Other Ambulatory Visit: Payer: Self-pay

## 2021-05-07 DIAGNOSIS — R001 Bradycardia, unspecified: Secondary | ICD-10-CM | POA: Diagnosis not present

## 2021-05-07 DIAGNOSIS — R1010 Upper abdominal pain, unspecified: Secondary | ICD-10-CM | POA: Diagnosis not present

## 2021-05-07 DIAGNOSIS — J45909 Unspecified asthma, uncomplicated: Secondary | ICD-10-CM | POA: Insufficient documentation

## 2021-05-07 DIAGNOSIS — R112 Nausea with vomiting, unspecified: Secondary | ICD-10-CM | POA: Diagnosis not present

## 2021-05-07 DIAGNOSIS — R630 Anorexia: Secondary | ICD-10-CM | POA: Diagnosis not present

## 2021-05-07 DIAGNOSIS — R1011 Right upper quadrant pain: Secondary | ICD-10-CM | POA: Diagnosis not present

## 2021-05-07 HISTORY — DX: Unspecified asthma, uncomplicated: J45.909

## 2021-05-07 LAB — URINALYSIS, ROUTINE W REFLEX MICROSCOPIC
Bilirubin Urine: NEGATIVE
Glucose, UA: NEGATIVE mg/dL
Hgb urine dipstick: NEGATIVE
Ketones, ur: 5 mg/dL — AB
Leukocytes,Ua: NEGATIVE
Nitrite: NEGATIVE
Protein, ur: NEGATIVE mg/dL
Specific Gravity, Urine: 1.036 — ABNORMAL HIGH (ref 1.005–1.030)
pH: 5 (ref 5.0–8.0)

## 2021-05-07 LAB — COMPREHENSIVE METABOLIC PANEL
ALT: 50 U/L — ABNORMAL HIGH (ref 0–44)
AST: 28 U/L (ref 15–41)
Albumin: 4.6 g/dL (ref 3.5–5.0)
Alkaline Phosphatase: 42 U/L (ref 38–126)
Anion gap: 8 (ref 5–15)
BUN: 14 mg/dL (ref 6–20)
CO2: 27 mmol/L (ref 22–32)
Calcium: 9.6 mg/dL (ref 8.9–10.3)
Chloride: 104 mmol/L (ref 98–111)
Creatinine, Ser: 1.09 mg/dL (ref 0.61–1.24)
GFR, Estimated: >60 mL/min (ref >60)
Glucose, Bld: 100 mg/dL — ABNORMAL HIGH (ref 70–99)
Potassium: 3.5 mmol/L (ref 3.5–5.1)
Sodium: 139 mmol/L (ref 135–145)
Total Bilirubin: 0.6 mg/dL (ref 0.3–1.2)
Total Protein: 7.3 g/dL (ref 6.5–8.1)

## 2021-05-07 LAB — CBC
HCT: 43 % (ref 39.0–52.0)
Hemoglobin: 14.6 g/dL (ref 13.0–17.0)
MCH: 31.1 pg (ref 26.0–34.0)
MCHC: 34 g/dL (ref 30.0–36.0)
MCV: 91.5 fL (ref 80.0–100.0)
Platelets: 207 10*3/uL (ref 150–400)
RBC: 4.7 MIL/uL (ref 4.22–5.81)
RDW: 12.1 % (ref 11.5–15.5)
WBC: 7.7 10*3/uL (ref 4.0–10.5)
nRBC: 0 % (ref 0.0–0.2)

## 2021-05-07 LAB — LIPASE, BLOOD: Lipase: 35 U/L (ref 11–51)

## 2021-05-07 MED ORDER — PANTOPRAZOLE SODIUM 20 MG PO TBEC: 20.0000 mg | DELAYED_RELEASE_TABLET | Freq: Every day | ORAL | 0 refills | Status: AC

## 2021-05-07 MED ORDER — OXYCODONE-ACETAMINOPHEN 5-325 MG PO TABS
1.0000 | ORAL_TABLET | Freq: Once | ORAL | Status: AC
  Administered 2021-05-07: 1 via ORAL
  Filled 2021-05-07: qty 1

## 2021-05-07 MED ORDER — LIDOCAINE VISCOUS HCL 2 % MT SOLN
15.0000 mL | Freq: Once | OROMUCOSAL | Status: AC
  Administered 2021-05-07: 15 mL via ORAL
  Filled 2021-05-07: qty 15

## 2021-05-07 MED ORDER — ALUM & MAG HYDROXIDE-SIMETH 200-200-20 MG/5ML PO SUSP
30.0000 mL | Freq: Once | ORAL | Status: AC
  Administered 2021-05-07: 30 mL via ORAL
  Filled 2021-05-07: qty 30

## 2021-05-07 MED ORDER — ONDANSETRON 4 MG PO TBDP
4.0000 mg | ORAL_TABLET | Freq: Once | ORAL | Status: AC | PRN
  Administered 2021-05-07: 4 mg via ORAL
  Filled 2021-05-07: qty 1

## 2021-05-07 NOTE — ED Triage Notes (Signed)
Pt woken up from sleep with upper abd pain. Pt states he threw up on way to ED. Normal stools and urination; denies diarrhea. No hx GERD.

## 2021-05-07 NOTE — ED Provider Notes (Signed)
[facility] EMERGENCY DEPARTMENT Provider Note   CSN: [id number] Arrival date & time: 05/07/21  0221     History Chief Complaint  Patient presents with   Abdominal Pain    Nathan Glover is a 20 y.o. male.  HPI  Patient is a 20 year old male who presents to the emergency department due to abdominal pain.  He states his symptoms started last night while he was lying in bed.  Reports pain in the upper abdomen.  Also reports an episode of associated nausea/vomiting in route to the emergency department.  He states that he could not visualize his vomitus because it was dark out.  Denies any fevers, chills, URI symptoms, chest pain, shortness of breath, diarrhea, hematochezia.  No history of similar symptoms.  He states that he had a "small snack yesterday afternoon" but otherwise had little p.o. intake yesterday evening.  Denies any alcohol use or drug use.     Past Medical History:  Diagnosis Date   Asthma     There are no problems to display for this patient.   History reviewed. No pertinent surgical history.     History reviewed. No pertinent family history.  Social History   Tobacco Use   Smoking status: Never   Smokeless tobacco: Never  Vaping Use   Vaping Use: Every day  Substance Use Topics   Alcohol use: Yes   Drug use: Never    Home Medications Prior to Admission medications   Medication Sig Start Date End Date Taking? Authorizing Provider  pantoprazole (PROTONIX) 20 MG tablet Take 1 tablet (20 mg total) by mouth daily. 05/07/21  Yes Placido Sou, PA-C  HYDROcodone-acetaminophen (NORCO/VICODIN) 5-325 MG per tablet Take 1-2 tablets by mouth every 4 (four) hours as needed for moderate pain or severe pain. 09/17/14   Mirian Mo, MD    Allergies    Patient has no known allergies.  Review of Systems   Review of Systems  All other systems reviewed and are negative. Ten systems reviewed and are negative for acute change, except  as noted in the HPI.   Physical Exam Updated Vital Signs BP 116/67   Pulse (!) 46   Temp 98.2 F (36.8 C) (Oral)   Resp 18   Ht 5\' 8"  (1.727 m)   Wt 83.9 kg   SpO2 100%   BMI 28.13 kg/m   Physical Exam Vitals and nursing note reviewed.  Constitutional:      General: He is not in acute distress.    Appearance: Normal appearance. He is not ill-appearing, toxic-appearing or diaphoretic.  HENT:     Head: Normocephalic and atraumatic.     Right Ear: External ear normal.     Left Ear: External ear normal.     Nose: Nose normal.     Mouth/Throat:     Mouth: Mucous membranes are moist.     Pharynx: Oropharynx is clear. No oropharyngeal exudate or posterior oropharyngeal erythema.  Eyes:     Extraocular Movements: Extraocular movements intact.  Cardiovascular:     Rate and Rhythm: Regular rhythm. Bradycardia present.     Pulses: Normal pulses.     Heart sounds: Normal heart sounds. No murmur heard.   No friction rub. No gallop.  Pulmonary:     Effort: Pulmonary effort is normal. No respiratory distress.     Breath sounds: Normal breath sounds. No stridor. No wheezing, rhonchi or rales.  Abdominal:     General: Abdomen is flat.  Palpations: Abdomen is soft.     Tenderness: There is abdominal tenderness.     Comments: Abdomen is flat and soft.  Mild tenderness noted along the right upper quadrant with moderate tenderness along the epigastrium.  Musculoskeletal:        General: Normal range of motion.     Cervical back: Normal range of motion and neck supple. No tenderness.  Skin:    General: Skin is warm and dry.  Neurological:     General: No focal deficit present.     Mental Status: He is alert and oriented to person, place, and time.  Psychiatric:        Mood and Affect: Mood normal.        Behavior: Behavior normal.    ED Results / Procedures / Treatments   Labs (all labs ordered are listed, but only abnormal results are displayed) Labs Reviewed  COMPREHENSIVE  METABOLIC PANEL - Abnormal; Notable for the following components:      Result Value   Glucose, Bld 100 (*)    ALT 50 (*)    All other components within normal limits  URINALYSIS, ROUTINE W REFLEX MICROSCOPIC - Abnormal; Notable for the following components:   Specific Gravity, Urine 1.036 (*)    Ketones, ur 5 (*)    All other components within normal limits  LIPASE, BLOOD  CBC   EKG None  Radiology US Abdomen Limited  Result Date: 05/07/2021 CLINICAL DATA:  Right upper quadrant pain. EXAM: ULTRASOUND ABDOMEN LIMITED RIGHT UPPER QUADRANT COMPARISON:  None. FINDINGS: Gallbladder: No gallstones or wall thickening visualized. No sonographic Murphy sign noted by sonographer. Common bile duct: Diameter: 2 mm. Liver: No focal lesion identified. Increased parenchymal echogenicity. Portal vein is patent on color Doppler imaging with normal direction of blood flow towards the liver. Other: None. IMPRESSION: Hepatic steatosis. Please note limited evaluation for focal hepatic masses in a patient with hepatic steatosis due to decreased penetration of the acoustic ultrasound waves. Electronically Signed   By: Tish Frederickson M.D.   On: 05/07/2021 04:24    Procedures Procedures   Medications Ordered in ED Medications  ondansetron (ZOFRAN-ODT) disintegrating tablet 4 mg (4 mg Oral Given 05/07/21 0245)  oxyCODONE-acetaminophen (PERCOCET/ROXICET) 5-325 MG per tablet 1 tablet (1 tablet Oral Given 05/07/21 0301)  alum & mag hydroxide-simeth (MAALOX/MYLANTA) 200-200-20 MG/5ML suspension 30 mL (30 mLs Oral Given 05/07/21 0958)    And  lidocaine (XYLOCAINE) 2 % viscous mouth solution 15 mL (15 mLs Oral Given 05/07/21 2952)    ED Course  I have reviewed the triage vital signs and the nursing notes.  Pertinent labs & imaging results that were available during my care of the patient were reviewed by me and considered in my medical decision making (see chart for details).    MDM Rules/Calculators/A&P                           Pt is a 20 y.o. male who presents to the ED d/t abdominal pain as well as n/v.   Labs: CBC without abnormalities. Lipase within normal limits at 35. CMP with a glucose of 100 and an ALT of 50. UA with an elevated specific gravity at 5 ketones.  Imaging: Ultrasound of the right upper quadrant shows hepatic steatosis.  No gallstones or wall thickening visualized.  No sonographic Murphy sign.  Common bile duct has a diameter of 2 mm.  I, Placido Sou, PA-C, personally reviewed and evaluated  these images and lab results as part of my medical decision-making.  Unsure of the source of the patient's abdominal pain.  Likely gastritis.  Reassuring lab work as well as ultrasound.  No leukocytosis.  Lipase within normal limits.  No electrolyte derangements.  Ultrasound of the right upper quadrant not consistent with an acute cholecystitis.  No cholelithiasis.  He does have significant pain along the epigastrium on his physical exam.  He was given Percocet as well as Zofran in triage with mild relief.  I gave him a GI cocktail which he states completely resolved his symptoms.  He is now sleeping comfortably in bed.  Feel the patient is stable for discharge at this time and he is agreeable.  Will discharge on a course of Protonix.  He was given a referral to gastroenterology if he finds that his symptoms are persistent.  He knows that if his symptoms worsen that he needs to come back to the emergency department.  He was given strict return precautions.  He verbalized understanding of the above plan.  His questions were answered and he was amicable at the time of discharge.  Note: Portions of this report may have been transcribed using voice recognition software. Every effort was made to ensure accuracy; however, inadvertent computerized transcription errors may be present.   Final Clinical Impression(s) / ED Diagnoses Final diagnoses:  Pain of upper abdomen   Rx / DC Orders ED  Discharge Orders          Ordered    pantoprazole (PROTONIX) 20 MG tablet  Daily        05/07/21 1049             Placido Sou, PA-C 05/07/21 1058    Linwood Dibbles, MD 05/08/21 [phone]

## 2021-05-07 NOTE — ED Provider Notes (Signed)
MSE was initiated and I personally evaluated the patient and placed orders (if any) at  2:52 AM on May 07, 2021.  Otherwise healthy 20 yo to ED after being woken up with upper abdominal pain with subsequent vomiting. No hematemesis, fever. No history of similar symptoms. No SOB, cough, CP.   Today's Vitals   05/07/21 0226 05/07/21 0234 05/07/21 0245  BP:  127/85   Pulse:  84   Resp:   16  Temp:  (!) 97.5 F (36.4 C)   TempSrc:  Oral   SpO2:  99%   Weight: 83.9 kg    Height: 5\' 8"  (1.727 m)    PainSc: 10-Worst pain ever     Body mass index is 28.13 kg/m.  RRR Lungs clear Abdomen tender to epigastric and RUQ.  The patient appears stable so that the remainder of the MSE may be completed by another provider.   , PA-C 05/07/21 0254    05/09/21, MD 05/07/21 [phone]

## 2021-05-07 NOTE — Discharge Instructions (Addendum)
I am prescribing you medication called Protonix.  This is an antacid that you can take once per day for the next month.  Please continue taking this as prescribed.  This will hopefully help with your stomach pain.  Please eat a bland diet for the next 2 to 3 weeks.  Avoid spicy foods, dairy, as well as alcohol.  Below is the contact information for a local gastroenterologist.  If you find your symptoms are persisting for the next 1 to 2 weeks please give them a call and schedule a follow-up appointment.  If your symptoms worsen, please come back to the emergency department.  It was a pleasure to meet you.

## 2021-05-08 ENCOUNTER — Telehealth: Payer: Self-pay

## 2021-05-08 NOTE — Telephone Encounter (Signed)
Transition Care Management Unsuccessful Follow-up Telephone Call  Date of discharge and from where:  05/07/2021 from [facility]  Attempts:  1st Attempt  Reason for unsuccessful TCM follow-up call:  Unable to leave message

## 2021-05-09 NOTE — Telephone Encounter (Signed)
Transition Care Management Unsuccessful Follow-up Telephone Call  Date of discharge and from where:  05/07/2021 from [facility]  Attempts:  2nd Attempt  Reason for unsuccessful TCM follow-up call:  Unable to leave message

## 2021-05-12 NOTE — Telephone Encounter (Signed)
Transition Care Management Unsuccessful Follow-up Telephone Call  Date of discharge and from where:  05/07/2021 from [facility]  Attempts:  3rd Attempt  Reason for unsuccessful TCM follow-up call:  Unable to reach patient

## 2021-05-16 DIAGNOSIS — Z419 Encounter for procedure for purposes other than remedying health state, unspecified: Secondary | ICD-10-CM | POA: Diagnosis not present

## 2021-06-16 DIAGNOSIS — Z419 Encounter for procedure for purposes other than remedying health state, unspecified: Secondary | ICD-10-CM | POA: Diagnosis not present

## 2021-07-17 DIAGNOSIS — Z419 Encounter for procedure for purposes other than remedying health state, unspecified: Secondary | ICD-10-CM | POA: Diagnosis not present

## 2021-08-16 DIAGNOSIS — Z419 Encounter for procedure for purposes other than remedying health state, unspecified: Secondary | ICD-10-CM | POA: Diagnosis not present

## 2021-09-10 ENCOUNTER — Ambulatory Visit: Payer: Medicaid Other | Admitting: Nurse Practitioner

## 2021-09-10 NOTE — Progress Notes (Deleted)
Subjective:    Patient ID: Kathreen Devoid Ducksworth, male    DOB: 2001/09/03, 20 y.o.   MRN: [id number]  HPI: Jedd Schulenburg Behl is a 20 y.o. male presenting for new patient visit to establish care.  Introduced to Publishing rights manager role and practice setting.  All questions answered.  Discussed provider/patient relationship and expectations.  No chief complaint on file.   No Known Allergies  Outpatient Encounter Medications as of 09/10/2021  Medication Sig   HYDROcodone-acetaminophen (NORCO/VICODIN) 5-325 MG per tablet Take 1-2 tablets by mouth every 4 (four) hours as needed for moderate pain or severe pain.   pantoprazole (PROTONIX) 20 MG tablet Take 1 tablet (20 mg total) by mouth daily.   No facility-administered encounter medications on file as of 09/10/2021.    Active Ambulatory Problems    Diagnosis Date Noted   No Active Ambulatory Problems   Resolved Ambulatory Problems    Diagnosis Date Noted   No Resolved Ambulatory Problems   Past Medical History:  Diagnosis Date   Asthma     Past Medical History:  Diagnosis Date   Asthma     No past surgical history on file.  Social History   Tobacco Use   Smoking status: Never   Smokeless tobacco: Never  Vaping Use   Vaping Use: Every day  Substance Use Topics   Alcohol use: Yes   Drug use: Never    No family history on file.  Review of Systems Per HPI unless specifically indicated above     Objective:    There were no vitals taken for this visit.  Wt Readings from Last 3 Encounters:  05/07/21 185 lb (83.9 kg)  02/11/19 155 lb (70.3 kg) (52 %, Z= 0.06)*  09/17/14 125 lb 10.6 oz (57 kg) (54 %, Z= 0.11)*   * Growth percentiles are based on CDC (Boys, 2-20 Years) data.    Physical Exam  Results for orders placed or performed during the hospital encounter of 05/07/21  Lipase, blood  Result Value Ref Range   Lipase 35 11 - 51 U/L  Comprehensive metabolic panel  Result Value Ref Range   Sodium 139  135 - 145 mmol/L   Potassium 3.5 3.5 - 5.1 mmol/L   Chloride 104 98 - 111 mmol/L   CO2 27 22 - 32 mmol/L   Glucose, Bld 100 (H) 70 - 99 mg/dL   BUN 14 6 - 20 mg/dL   Creatinine, Ser 9.56 0.61 - 1.24 mg/dL   Calcium 9.6 8.9 - 21.3 mg/dL   Total Protein 7.3 6.5 - 8.1 g/dL   Albumin 4.6 3.5 - 5.0 g/dL   AST 28 15 - 41 U/L   ALT 50 (H) 0 - 44 U/L   Alkaline Phosphatase 42 38 - 126 U/L   Total Bilirubin 0.6 0.3 - 1.2 mg/dL   GFR, Estimated >08 >65 mL/min   Anion gap 8 5 - 15  CBC  Result Value Ref Range   WBC 7.7 4.0 - 10.5 K/uL   RBC 4.70 4.22 - 5.81 MIL/uL   Hemoglobin 14.6 13.0 - 17.0 g/dL   HCT 78.4 69.6 - 29.5 %   MCV 91.5 80.0 - 100.0 fL   MCH 31.1 26.0 - 34.0 pg   MCHC 34.0 30.0 - 36.0 g/dL   RDW 28.4 13.2 - 44.0 %   Platelets 207 150 - 400 K/uL   nRBC 0.0 0.0 - 0.2 %  Urinalysis, Routine w reflex microscopic Urine, Clean Catch  Result  Value Ref Range   Color, Urine YELLOW YELLOW   APPearance CLEAR CLEAR   Specific Gravity, Urine 1.036 (H) 1.005 - 1.030   pH 5.0 5.0 - 8.0   Glucose, UA NEGATIVE NEGATIVE mg/dL   Hgb urine dipstick NEGATIVE NEGATIVE   Bilirubin Urine NEGATIVE NEGATIVE   Ketones, ur 5 (A) NEGATIVE mg/dL   Protein, ur NEGATIVE NEGATIVE mg/dL   Nitrite NEGATIVE NEGATIVE   Leukocytes,Ua NEGATIVE NEGATIVE      Assessment & Plan:   Problem List Items Addressed This Visit   None    Follow up plan: No follow-ups on file.

## 2021-09-16 DIAGNOSIS — Z419 Encounter for procedure for purposes other than remedying health state, unspecified: Secondary | ICD-10-CM | POA: Diagnosis not present

## 2021-10-16 DIAGNOSIS — Z419 Encounter for procedure for purposes other than remedying health state, unspecified: Secondary | ICD-10-CM | POA: Diagnosis not present

## 2021-11-16 DIAGNOSIS — Z419 Encounter for procedure for purposes other than remedying health state, unspecified: Secondary | ICD-10-CM | POA: Diagnosis not present

## 2021-12-17 DIAGNOSIS — Z419 Encounter for procedure for purposes other than remedying health state, unspecified: Secondary | ICD-10-CM | POA: Diagnosis not present

## 2022-01-14 DIAGNOSIS — Z419 Encounter for procedure for purposes other than remedying health state, unspecified: Secondary | ICD-10-CM | POA: Diagnosis not present

## 2022-02-14 DIAGNOSIS — Z419 Encounter for procedure for purposes other than remedying health state, unspecified: Secondary | ICD-10-CM | POA: Diagnosis not present

## 2022-02-16 DIAGNOSIS — J029 Acute pharyngitis, unspecified: Secondary | ICD-10-CM | POA: Diagnosis not present

## 2022-03-16 DIAGNOSIS — Z419 Encounter for procedure for purposes other than remedying health state, unspecified: Secondary | ICD-10-CM | POA: Diagnosis not present

## 2022-04-16 DIAGNOSIS — Z419 Encounter for procedure for purposes other than remedying health state, unspecified: Secondary | ICD-10-CM | POA: Diagnosis not present

## 2022-05-16 DIAGNOSIS — Z419 Encounter for procedure for purposes other than remedying health state, unspecified: Secondary | ICD-10-CM | POA: Diagnosis not present

## 2022-06-16 DIAGNOSIS — Z419 Encounter for procedure for purposes other than remedying health state, unspecified: Secondary | ICD-10-CM | POA: Diagnosis not present

## 2022-07-17 DIAGNOSIS — Z419 Encounter for procedure for purposes other than remedying health state, unspecified: Secondary | ICD-10-CM | POA: Diagnosis not present

## 2022-08-16 DIAGNOSIS — Z419 Encounter for procedure for purposes other than remedying health state, unspecified: Secondary | ICD-10-CM | POA: Diagnosis not present

## 2022-09-16 DIAGNOSIS — Z419 Encounter for procedure for purposes other than remedying health state, unspecified: Secondary | ICD-10-CM | POA: Diagnosis not present

## 2022-10-16 DIAGNOSIS — Z419 Encounter for procedure for purposes other than remedying health state, unspecified: Secondary | ICD-10-CM | POA: Diagnosis not present

## 2022-10-30 IMAGING — US US ABDOMEN LIMITED
1 series · 14 of 25 positions shown · non-contrast
Comparison: None.

CLINICAL DATA: Right upper quadrant pain.

EXAM:
ULTRASOUND ABDOMEN LIMITED RIGHT UPPER QUADRANT

[Series 1: us abdomen limited · 14 of 40 slices shown]
[im 1/40]
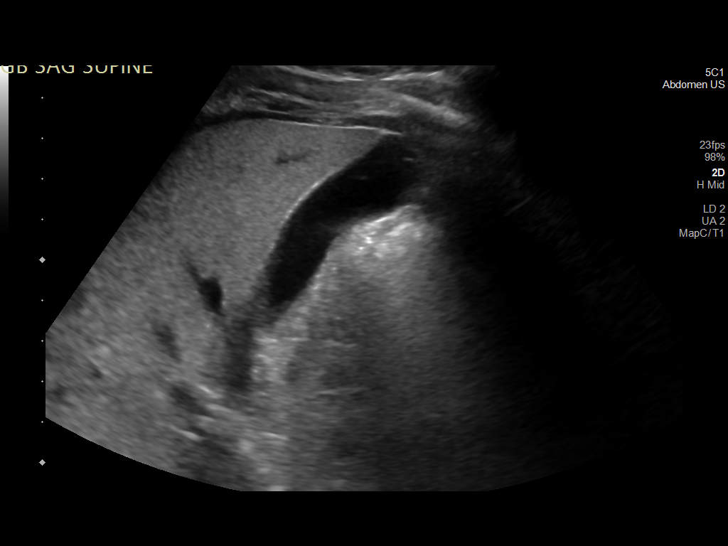
[im 4/40]
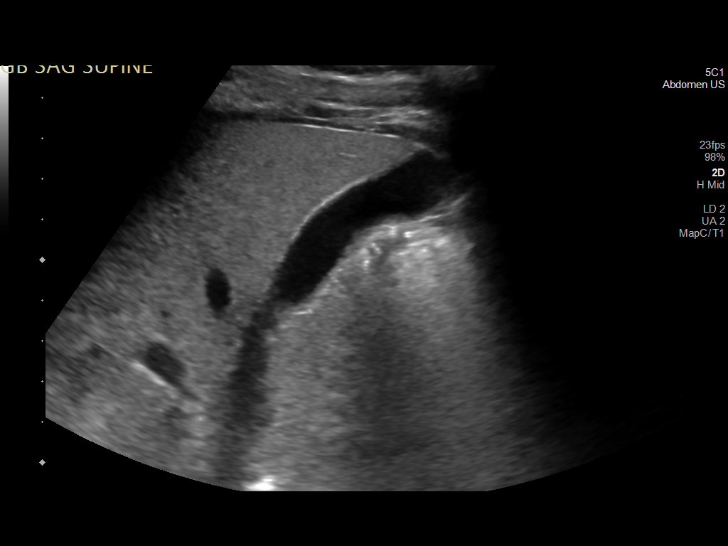
[im 7/40]
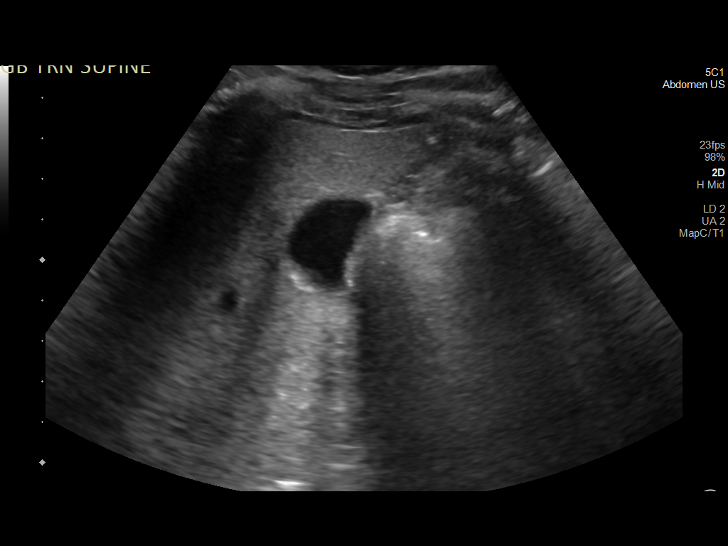
[im 10/40]
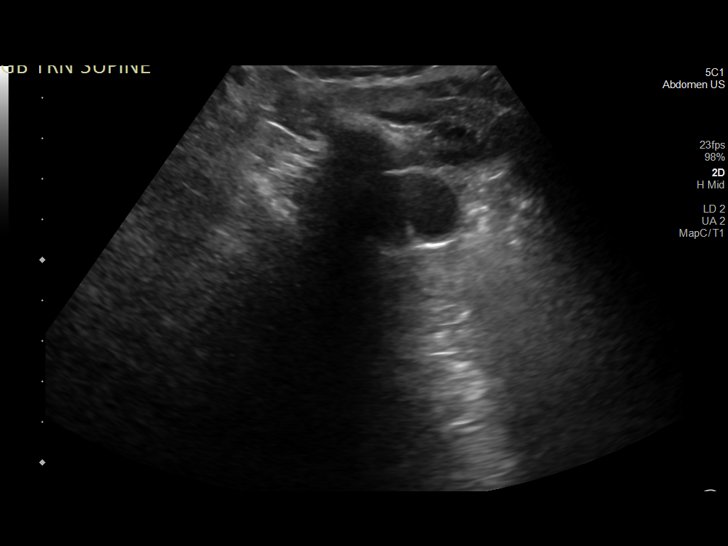
[im 14/40]
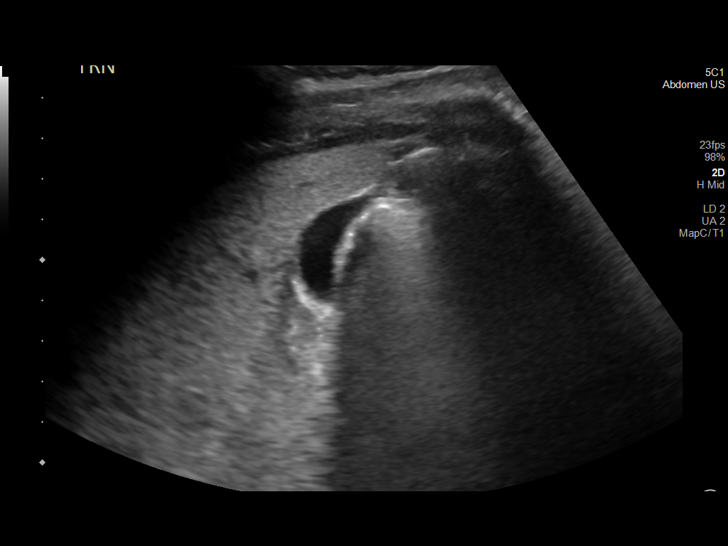
[im 15/40]
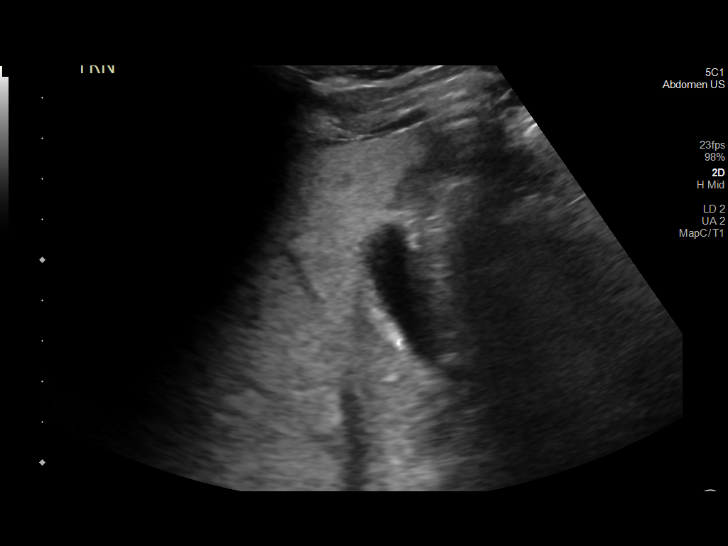
[im 18/40]
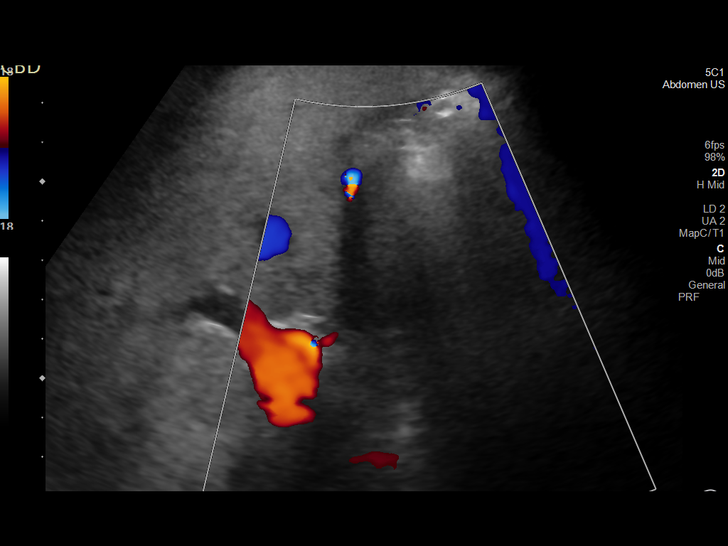
[im 22/40]
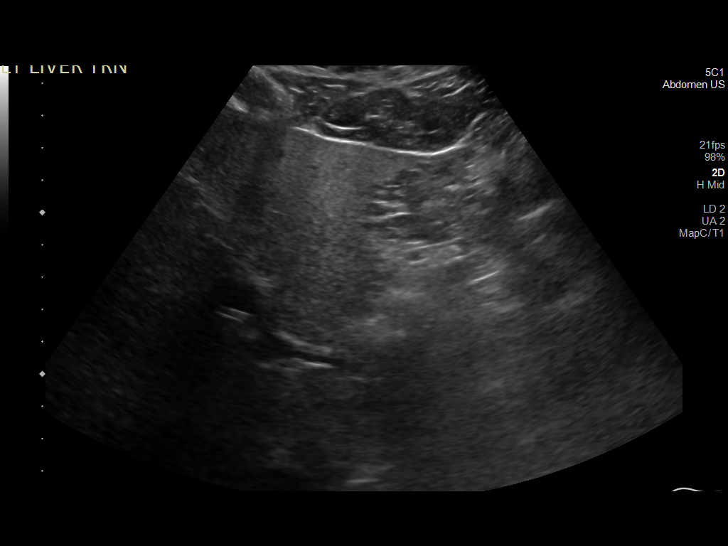
[im 25/40]
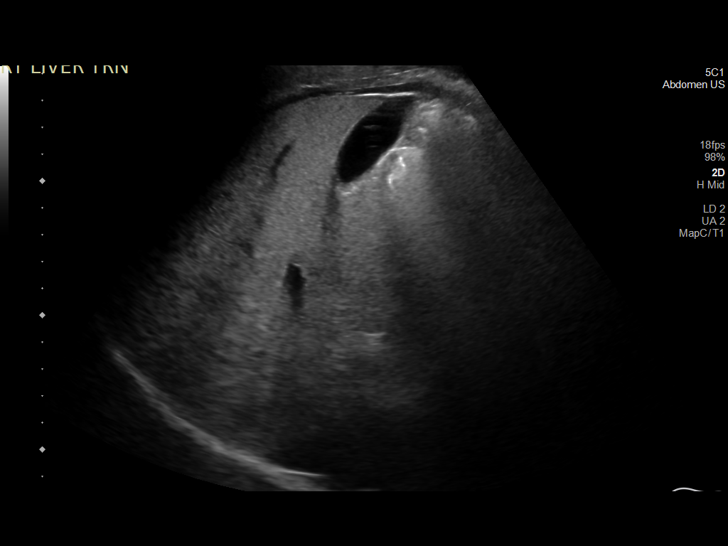
[im 27/40]
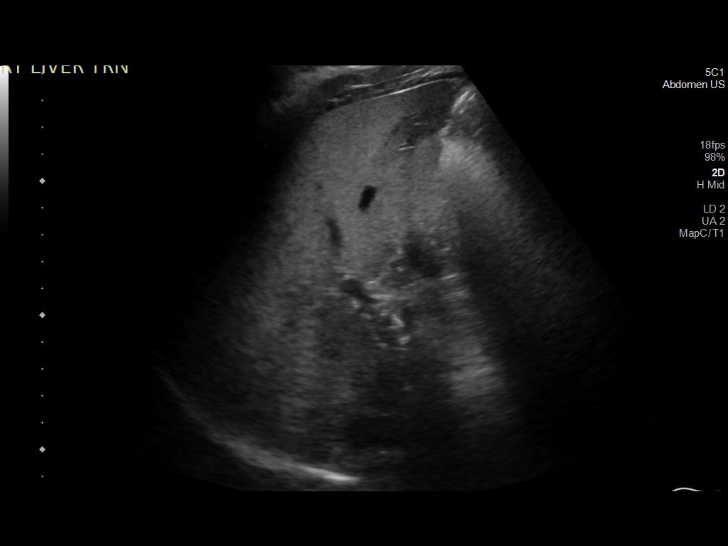
[im 30/40]
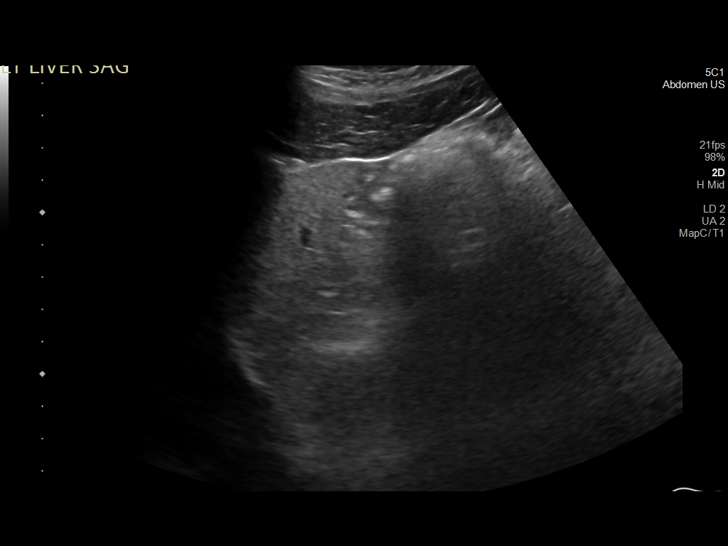
[im 33/40]
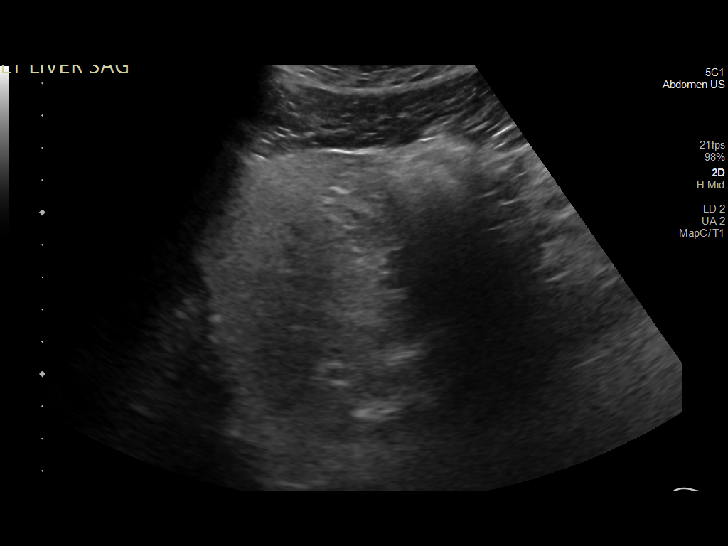
[im 36/40]
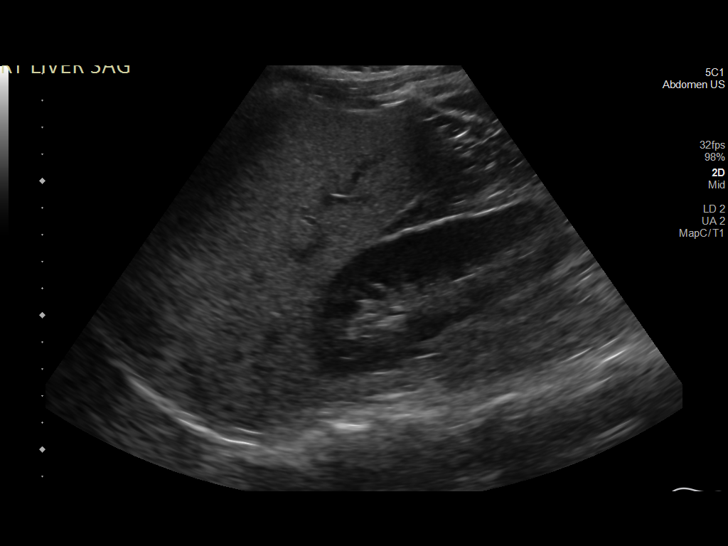
[im 40/40]
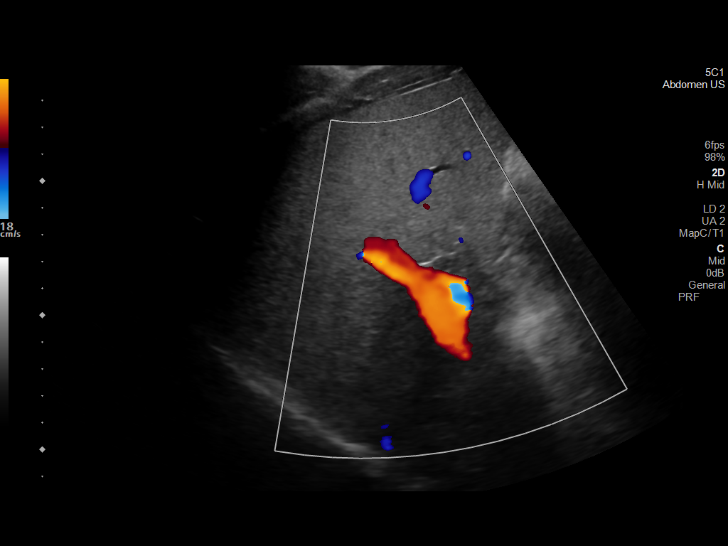

[14 of 25 positions shown; findings below may reference images not displayed]

FINDINGS: Gallbladder:

No gallstones or wall thickening visualized. No sonographic Murphy
sign noted by sonographer.

Common bile duct:

Diameter: 2 mm.

Liver:

No focal lesion identified. Increased parenchymal echogenicity.
Portal vein is patent on color Doppler imaging with normal direction
of blood flow towards the liver.

Other: None.
IMPRESSION: Hepatic steatosis. Please note limited evaluation for focal hepatic
masses in a patient with hepatic steatosis due to decreased
penetration of the acoustic ultrasound waves.

## 2022-11-16 DIAGNOSIS — Z419 Encounter for procedure for purposes other than remedying health state, unspecified: Secondary | ICD-10-CM | POA: Diagnosis not present

## 2022-12-17 DIAGNOSIS — Z419 Encounter for procedure for purposes other than remedying health state, unspecified: Secondary | ICD-10-CM | POA: Diagnosis not present

## 2023-01-10 DIAGNOSIS — R0789 Other chest pain: Secondary | ICD-10-CM | POA: Diagnosis not present

## 2023-01-15 DIAGNOSIS — Z419 Encounter for procedure for purposes other than remedying health state, unspecified: Secondary | ICD-10-CM | POA: Diagnosis not present

## 2023-02-15 DIAGNOSIS — Z419 Encounter for procedure for purposes other than remedying health state, unspecified: Secondary | ICD-10-CM | POA: Diagnosis not present

## 2023-03-17 DIAGNOSIS — Z419 Encounter for procedure for purposes other than remedying health state, unspecified: Secondary | ICD-10-CM | POA: Diagnosis not present

## 2023-03-19 ENCOUNTER — Other Ambulatory Visit: Payer: Self-pay

## 2023-04-17 DIAGNOSIS — Z419 Encounter for procedure for purposes other than remedying health state, unspecified: Secondary | ICD-10-CM | POA: Diagnosis not present

## 2023-05-17 DIAGNOSIS — Z419 Encounter for procedure for purposes other than remedying health state, unspecified: Secondary | ICD-10-CM | POA: Diagnosis not present

## 2023-05-18 ENCOUNTER — Ambulatory Visit: Payer: Medicaid Other | Admitting: Family Medicine

## 2023-06-17 DIAGNOSIS — Z419 Encounter for procedure for purposes other than remedying health state, unspecified: Secondary | ICD-10-CM | POA: Diagnosis not present

## 2023-07-18 DIAGNOSIS — Z419 Encounter for procedure for purposes other than remedying health state, unspecified: Secondary | ICD-10-CM | POA: Diagnosis not present

## 2023-08-17 DIAGNOSIS — Z419 Encounter for procedure for purposes other than remedying health state, unspecified: Secondary | ICD-10-CM | POA: Diagnosis not present

## 2023-09-17 DIAGNOSIS — Z419 Encounter for procedure for purposes other than remedying health state, unspecified: Secondary | ICD-10-CM | POA: Diagnosis not present

## 2023-10-17 DIAGNOSIS — Z419 Encounter for procedure for purposes other than remedying health state, unspecified: Secondary | ICD-10-CM | POA: Diagnosis not present

## 2023-11-17 DIAGNOSIS — Z419 Encounter for procedure for purposes other than remedying health state, unspecified: Secondary | ICD-10-CM | POA: Diagnosis not present

## 2023-12-18 DIAGNOSIS — Z419 Encounter for procedure for purposes other than remedying health state, unspecified: Secondary | ICD-10-CM | POA: Diagnosis not present

## 2024-01-15 DIAGNOSIS — Z419 Encounter for procedure for purposes other than remedying health state, unspecified: Secondary | ICD-10-CM | POA: Diagnosis not present

## 2024-02-26 DIAGNOSIS — Z419 Encounter for procedure for purposes other than remedying health state, unspecified: Secondary | ICD-10-CM | POA: Diagnosis not present

## 2024-03-27 DIAGNOSIS — Z419 Encounter for procedure for purposes other than remedying health state, unspecified: Secondary | ICD-10-CM | POA: Diagnosis not present

## 2024-04-27 DIAGNOSIS — Z419 Encounter for procedure for purposes other than remedying health state, unspecified: Secondary | ICD-10-CM | POA: Diagnosis not present

## 2024-05-27 DIAGNOSIS — Z419 Encounter for procedure for purposes other than remedying health state, unspecified: Secondary | ICD-10-CM | POA: Diagnosis not present

## 2024-06-27 DIAGNOSIS — Z419 Encounter for procedure for purposes other than remedying health state, unspecified: Secondary | ICD-10-CM | POA: Diagnosis not present

## 2024-07-28 DIAGNOSIS — Z419 Encounter for procedure for purposes other than remedying health state, unspecified: Secondary | ICD-10-CM | POA: Diagnosis not present

## 2024-08-27 DIAGNOSIS — Z419 Encounter for procedure for purposes other than remedying health state, unspecified: Secondary | ICD-10-CM | POA: Diagnosis not present

## 2024-09-21 DIAGNOSIS — R21 Rash and other nonspecific skin eruption: Secondary | ICD-10-CM | POA: Diagnosis not present

## 2024-09-21 DIAGNOSIS — Z113 Encounter for screening for infections with a predominantly sexual mode of transmission: Secondary | ICD-10-CM | POA: Diagnosis not present

## 2024-09-21 DIAGNOSIS — B356 Tinea cruris: Secondary | ICD-10-CM | POA: Diagnosis not present

## 2024-09-21 DIAGNOSIS — N481 Balanitis: Secondary | ICD-10-CM | POA: Diagnosis not present
